# Patient Record
Sex: Female | Born: 2000 | Hispanic: Yes | Marital: Single | Smoking: Never smoker
Health system: Southern US, Community
[De-identification: ages and names within clinical notes are randomized; demographics above are authoritative.]

## PROBLEM LIST (undated history)

## (undated) DIAGNOSIS — H539 Unspecified visual disturbance: Secondary | ICD-10-CM

## (undated) HISTORY — DX: Unspecified visual disturbance: H53.9

---
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
## 2018-01-03 ENCOUNTER — Ambulatory Visit: Payer: Self-pay

## 2018-01-03 ENCOUNTER — Encounter: Payer: Self-pay | Admitting: Pediatrics

## 2018-01-12 ENCOUNTER — Ambulatory Visit (INDEPENDENT_AMBULATORY_CARE_PROVIDER_SITE_OTHER): Payer: Self-pay

## 2018-01-12 DIAGNOSIS — Z23 Encounter for immunization: Secondary | ICD-10-CM

## 2018-01-12 NOTE — Progress Notes (Signed)
Here for HepB #2. Allergies reviewed, no current illness or other concerns. Vaccine given and tolerated well. RTC 03/09/18 for appointment with Dr. Luna FuseEttefagh and prn for acute care.

## 2018-01-19 ENCOUNTER — Telehealth: Payer: Self-pay | Admitting: Pediatrics

## 2018-01-19 NOTE — Telephone Encounter (Signed)
Partially completed form placed in Dr. Ettefagh's folder. 

## 2018-01-19 NOTE — Telephone Encounter (Signed)
Mother Ronalee RedSandra Cortez-Diaz [phone] dropped off school physical & concussion forms to be completed by provider. Call mother when complete and ready for pick up. Placed forms in blue pod folder.

## 2018-01-20 ENCOUNTER — Telehealth: Payer: Self-pay | Admitting: Pediatrics

## 2018-01-20 NOTE — Telephone Encounter (Signed)
Please cal April Wagner asz soon form is ready for pick up @ [phone]

## 2018-01-20 NOTE — Telephone Encounter (Signed)
New form with completed first page given to Dr. Luna FuseEttefagh.

## 2018-01-20 NOTE — Telephone Encounter (Signed)
Completed form copied and taken to front desk. Mother notified.

## 2018-03-09 ENCOUNTER — Encounter: Payer: Self-pay | Admitting: Pediatrics

## 2018-03-09 ENCOUNTER — Ambulatory Visit (INDEPENDENT_AMBULATORY_CARE_PROVIDER_SITE_OTHER): Payer: Self-pay | Admitting: Pediatrics

## 2018-03-09 ENCOUNTER — Other Ambulatory Visit: Payer: Self-pay

## 2018-03-09 VITALS — BP 120/66 | Ht 65.0 in | Wt 172.4 lb

## 2018-03-09 DIAGNOSIS — Z9229 Personal history of other drug therapy: Secondary | ICD-10-CM

## 2018-03-09 DIAGNOSIS — S060X0A Concussion without loss of consciousness, initial encounter: Secondary | ICD-10-CM

## 2018-03-09 DIAGNOSIS — R7401 Elevation of levels of liver transaminase levels: Secondary | ICD-10-CM

## 2018-03-09 DIAGNOSIS — E663 Overweight: Secondary | ICD-10-CM

## 2018-03-09 DIAGNOSIS — R740 Nonspecific elevation of levels of transaminase and lactic acid dehydrogenase [LDH]: Secondary | ICD-10-CM

## 2018-03-09 NOTE — Progress Notes (Signed)
Subjective:    April Wagner is a 17 y.o. old female here with her mother for follow-up transaminitis.    HPI Currently on the soccer at team - playing defense.  Started in late February.  Exercising daily for soccer practice.  Still eating healthy foods with fruits and vegetables daily and drinking water.  Avoiding sweetened drinks.  Screen time is <2 hours.      Last had labs for transaminitis about 3 months ago and AST/ALT were improving at that time.  Has had a liver ultrasound shouing increased liver echogenicity consistent with fatty liver.    Got hit in the face on the right side by the soccer ball yesterday, the ball was kicked forcefully at a close range.  This was an accident.  She did not pass out or fall down but her glasses were knocked off her face.  She has a mild headache this morning when she woke up.  Face felt stinging and a little numb yesterday after the injury.  Today the numbness is gone but that side of her face feels sore.   She also has some marks on her face from where her glasses his her nose.  No eye injury or vision.     Review of Systems  Eyes: Negative for visual disturbance.  Skin: Negative for wound.  Neurological: Positive for headaches. Negative for dizziness, syncope, facial asymmetry, weakness, light-headedness and numbness.    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  April Wagner  has a past medical history of Vision abnormalities.  Immunizations needed: none     Objective:    BP 120/66 (BP Location: Right Arm, Patient Position: Sitting, Cuff Size: Normal)   Ht 5\' 5"  (1.651 m)   Wt 172 lb 6.4 oz (78.2 kg)   BMI 28.69 kg/m    Physical Exam  Constitutional: She is oriented to person, place, and time. She appears well-developed and well-nourished. No distress.  HENT:   Head: Normocephalic.  Right Ear: External ear normal.  Left Ear: External ear normal.  Nose: Nose normal.  Mouth/Throat: Oropharynx is clear and moist.  Normal TMs  Eyes: Pupils are equal, round, and reactive to light. Conjunctivae and EOM are normal. Right eye exhibits no discharge. Left eye exhibits no discharge.  Cardiovascular: Normal rate, regular rhythm and normal heart sounds.  Pulmonary/Chest: Effort normal and breath sounds normal.  Abdominal: Soft. Bowel sounds are normal. She exhibits no distension. There is no tenderness.  Neurological: She is alert and oriented to person, place, and time. She has normal reflexes. No cranial nerve deficit. Coordination normal.  Skin:  Very small bruise/abrasion on the right side of the nasal bridge and just below the right eyebrow  Nursing note and vitals reviewed.      Assessment and Plan:   April Wagner is a 17 y.o. old female with  1. Concussion without loss of consciousness, initial encounter Patient with mild headache this am.  No LOC or neuro deficits.  Mild bruising around right eye from glasses but no ocular involvement.  Plan for gradual return to play with monitoring for post-conscussive symptoms at each step.  No exercise today, may return to light jogging tomorrow, then fast running the next day, then soccer practice on Monday, then competition the next day.  If any return of symptoms, she will return to the previous day until asymptomatic. NOte given  for no soccer practice until Monday.  Supportive cares, return precautions, and emergency procedures reviewed.   2. Transaminitis Due for repeat labs today for monitoring.  Will call patient with results.  Will also repeat Hep B antibody testing today since she has had 2 repeat doses of the Hep B vaccine series and is getting labs drawn today.  IF still antibody negative she will need a 3rd Hep B vaccine dose in 3 months.   - Comprehensive metabolic panel - Gamma GT - Hepatitis B surface  antibody  3. Overweight Weight is up 5 pounds since last visit, but she reports adhering to healthy habits and increased physical activity.  Continue to monitor for now.       Return for 17 year old [facility] with Dr. Luna Fuse in 4 months.  Clifton Custard, MD

## 2018-03-10 LAB — COMPREHENSIVE METABOLIC PANEL
AG RATIO: 1.2 (calc) (ref 1.0–2.5)
ALBUMIN MSPROF: 4.5 g/dL (ref 3.6–5.1)
ALKALINE PHOSPHATASE (APISO): 121 U/L (ref 47–176)
ALT: 76 U/L — ABNORMAL HIGH (ref 5–32)
AST: 52 U/L — ABNORMAL HIGH (ref 12–32)
BUN: 10 mg/dL (ref 7–20)
CO2: 27 mmol/L (ref 20–32)
Calcium: 9.7 mg/dL (ref 8.9–10.4)
Chloride: 106 mmol/L (ref 98–110)
Creat: 0.64 mg/dL (ref 0.50–1.00)
Globulin: 3.9 g/dL (calc) — ABNORMAL HIGH (ref 2.0–3.8)
Glucose, Bld: 86 mg/dL (ref 65–99)
POTASSIUM: 4 mmol/L (ref 3.8–5.1)
SODIUM: 141 mmol/L (ref 135–146)
Total Bilirubin: 0.4 mg/dL (ref 0.2–1.1)
Total Protein: 8.4 g/dL — ABNORMAL HIGH (ref 6.3–8.2)

## 2018-03-10 LAB — GAMMA GT: GGT: 19 U/L (ref 6–26)

## 2018-03-10 LAB — HEPATITIS B SURFACE ANTIBODY,QUALITATIVE: Hep B S Ab: REACTIVE — AB

## 2018-07-11 ENCOUNTER — Ambulatory Visit: Payer: Self-pay

## 2018-08-25 ENCOUNTER — Ambulatory Visit (INDEPENDENT_AMBULATORY_CARE_PROVIDER_SITE_OTHER): Payer: Self-pay | Admitting: *Deleted

## 2018-08-25 DIAGNOSIS — Z23 Encounter for immunization: Secondary | ICD-10-CM

## 2018-08-29 ENCOUNTER — Encounter: Payer: Self-pay | Admitting: Pediatrics

## 2018-08-29 ENCOUNTER — Ambulatory Visit (INDEPENDENT_AMBULATORY_CARE_PROVIDER_SITE_OTHER): Payer: Self-pay | Admitting: Pediatrics

## 2018-08-29 ENCOUNTER — Other Ambulatory Visit: Payer: Self-pay

## 2018-08-29 ENCOUNTER — Ambulatory Visit (INDEPENDENT_AMBULATORY_CARE_PROVIDER_SITE_OTHER): Payer: Self-pay | Admitting: Licensed Clinical Social Worker

## 2018-08-29 VITALS — BP 118/60 | Ht 65.25 in | Wt 181.5 lb

## 2018-08-29 DIAGNOSIS — Z1331 Encounter for screening for depression: Secondary | ICD-10-CM

## 2018-08-29 DIAGNOSIS — Z0001 Encounter for general adult medical examination with abnormal findings: Secondary | ICD-10-CM

## 2018-08-29 DIAGNOSIS — R740 Nonspecific elevation of levels of transaminase and lactic acid dehydrogenase [LDH]: Secondary | ICD-10-CM

## 2018-08-29 DIAGNOSIS — Z68.41 Body mass index (BMI) pediatric, 85th percentile to less than 95th percentile for age: Secondary | ICD-10-CM

## 2018-08-29 DIAGNOSIS — Z113 Encounter for screening for infections with a predominantly sexual mode of transmission: Secondary | ICD-10-CM

## 2018-08-29 DIAGNOSIS — E663 Overweight: Secondary | ICD-10-CM

## 2018-08-29 DIAGNOSIS — R7401 Elevation of levels of liver transaminase levels: Secondary | ICD-10-CM

## 2018-08-29 LAB — POCT GLYCOSYLATED HEMOGLOBIN (HGB A1C): HEMOGLOBIN A1C: 5.2 % (ref 4.0–5.6)

## 2018-08-29 LAB — POCT RAPID HIV: RAPID HIV, POC: NEGATIVE

## 2018-08-29 NOTE — BH Specialist Note (Signed)
Integrated Behavioral Health Initial Visit  MRN: [id number] Name: April Wagner  Number of Integrated Behavioral Health Clinician visits:: 1/6 Session Start time: 9:37  Session End time: 9:40 Total time: 3 mins, no charge due to brief visit  Type of Service: Integrated Behavioral Health- Individual/Family Interpretor:No. Interpretor Name and Language: n/a [facility] E. Dewain Penningshola present for the length of the visit w/ pt's permission   Warm Hand Off Completed.       SUBJECTIVE: April Wagner is a 17 y.o. female accompanied by Mother Patient was referred by Dr. Luna FuseEttefagh for PHQ Review.  [facility] introduced services in Integrated Care Model and role within the clinic. [facility] provided [facility] Promo and business card with contact information. Pt voiced understanding and denied any need for services at this time. [facility] is open to visits in the future as needed.   OBJECTIVE: Mood: Euthymic and Affect: Appropriate Risk of harm to self or others: No plan to harm self or others   GOALS ADDRESSED: Patient will: 1. Identify barriers to social emotional development 2. Increase awareness of [facility] role in integrated care model  INTERVENTIONS: Interventions utilized: Psychoeducation and/or Health Education  Standardized Assessments completed: PHQ 9 Modified for Teens; score of 4, results in flowsheets  Noralyn PickHannah G Moore, LPCA

## 2018-08-29 NOTE — Progress Notes (Signed)
Adolescent Well Care Visit April NorrisKaren Wagner is a 17 y.o. female who is here for well care.    PCP:  April Wagner, April Ramaswamy Scott, MD   History was provided by the patient and mother.  Current Issues: Current concerns include   Transaminitis - AST and ALT were improved from prior but still elevated on last check 03/09/18.  AST was 52 and ALT was 76.  Patient reports that she has gotten several bills from the lab and is worried about them.  Nutrition: Nutrition/Eating Behaviors: eats a variety,  Except not much milk Adequate calcium in diet?: no Supplements/ Vitamins: none  Exercise/ Media: Play any Sports?/ Exercise: not doing much currently Screen Time:  > 2 hours-counseling provided Media Rules or Monitoring?: yes  Sleep:  Sleep: all night, light snoring  Social Screening: Lives with:  Parents and sister Parental relations:  good Activities, Work, and Regulatory affairs officerChores?: not working currently Stressors of note: yes - not in school and not working  Education: Not in school - graduated last spring  Menstruation:   No LMP recorded. Menstrual History: regular, every month, lasts 5 days   Confidential Social History: Tobacco?  no Secondhand smoke exposure?  no Drugs/ETOH?  no  Sexually Active?  no   Pregnancy Prevention: absintence   Safe at home & in relationships?  Yes Safe to self?  Yes   Screenings: Patient has a dental home: yes   PHQ-9 completed and results indicated no signs of depression (total score of 4)  Physical Exam:  Vitals:   08/29/18 0834  BP: 118/60  Weight: 181 lb 8 oz (82.3 kg)  Height: 5' 5.25" (1.657 m)   BP 118/60 (BP Location: Right Arm, Patient Position: Sitting, Cuff Size: Normal)   Ht 5' 5.25" (1.657 m)   Wt 181 lb 8 oz (82.3 kg)   BMI 29.97 kg/m  Body mass index: body mass index is 29.97 kg/m. Blood pressure percentiles are not available for patients who are 18 years or older.   Hearing Screening   Method: Audiometry   125Hz  250Hz  500Hz   1000Hz  2000Hz  3000Hz  4000Hz  6000Hz  8000Hz   Right ear:   20 20 20  20     Left ear:   20 20 20  20       Visual Acuity Screening   Right eye Left eye Both eyes  Without correction:     With correction: 10/10 10/10 10/10     General Appearance:   alert, oriented, no acute distress and well nourished  HENT: Normocephalic, no obvious abnormality, conjunctiva clear  Mouth:   Normal appearing teeth, no obvious discoloration, dental caries, or dental caps  Neck:   Supple; thyroid: no enlargement, symmetric, no tenderness/mass/nodules  Chest Tanner IV female  Lungs:   Clear to auscultation bilaterally, normal work of breathing  Heart:   Regular rate and rhythm, S1 and S2 normal, no murmurs;   Abdomen:   Soft, non-tender, no mass, or organomegaly  GU normal female external genitalia, pelvic not performed, Tanner stage V, shaved pubic hair  Musculoskeletal:   Tone and strength strong and symmetrical, all extremities               Lymphatic:   No cervical adenopathy  Skin/Hair/Nails:   Skin warm, dry and intact, no rashes, no bruises or petechiae  Neurologic:   Strength, gait, and coordination normal and age-appropriate     Assessment and Plan:   Routine screening for STI (sexually transmitted infection) Patient denies sexual activity - at risk age group. -  C. trachomatis/N. gonorrhoeae RNA - POCT Rapid HIV  Overweight, body mass index, pediatric, 85th percentile to less than 95th percentile for age 52-2-1-0 goals of healthy active living and MyPlate reviewed.  - POCT glycosylated hemoglobin (Hb A1C) -   Transaminitis Likely due to non-alcoholic fatty liver.  Due for repeat labs today.   - ALT - AST  Hearing screening result:normal Vision screening result: normal   Return for recheck healthy habits/liver function in 3 months with Dr. Luna Fuse.April Custard, MD

## 2018-08-30 LAB — AST: AST: 67 U/L — ABNORMAL HIGH (ref 12–32)

## 2018-08-30 LAB — C. TRACHOMATIS/N. GONORRHOEAE RNA
C. TRACHOMATIS RNA, TMA: NOT DETECTED
N. GONORRHOEAE RNA, TMA: NOT DETECTED

## 2018-08-30 LAB — ALT: ALT: 118 U/L — ABNORMAL HIGH (ref 5–32)

## 2018-10-24 ENCOUNTER — Telehealth: Payer: Self-pay | Admitting: Pediatrics

## 2018-10-24 NOTE — Telephone Encounter (Signed)
-----   Message from Laney PotashJennifer A Guzman sent at 09/12/2018  3:59 PM EDT ----- Regarding: RE: Dental referral question Patient just needs to call them, they don't need a referral.    ----- Message ----- From: Clifton CustardEttefagh, Kate Scott, MD Sent: 08/31/2018  10:22 AM EDT To: Laney PotashJennifer A Guzman Subject: Dental referral question                       This patient needs a referral to the adult dental clinic at the [facility] department office on [address] since she is 16 and has the orange card.  Do you know how I can put that referral in?  Thanks, Voncille LoKate Ettefagh

## 2018-10-24 NOTE — Telephone Encounter (Signed)
I called and advised her mother that she should contact the dental clinic directly.

## 2019-01-25 ENCOUNTER — Ambulatory Visit: Payer: Self-pay | Admitting: Pediatrics

## 2019-02-06 ENCOUNTER — Ambulatory Visit: Payer: Self-pay

## 2019-02-13 ENCOUNTER — Ambulatory Visit: Payer: Self-pay | Admitting: Pediatrics

## 2019-02-20 ENCOUNTER — Ambulatory Visit: Payer: Self-pay

## 2019-02-20 ENCOUNTER — Ambulatory Visit: Payer: Self-pay | Admitting: Pediatrics

## 2019-04-24 ENCOUNTER — Ambulatory Visit: Payer: Self-pay | Admitting: Pediatrics

## 2019-08-20 IMAGING — US US ABDOMEN LIMITED
1 series · 14 of 25 positions shown · non-contrast
Comparison: None.

CLINICAL DATA: Elevated liver enzymes

EXAM:
ULTRASOUND ABDOMEN LIMITED RIGHT UPPER QUADRANT

[Series 1: us abdomen limited · 0.21mm/px · 14 of 35 slices shown]
[im 1/35]
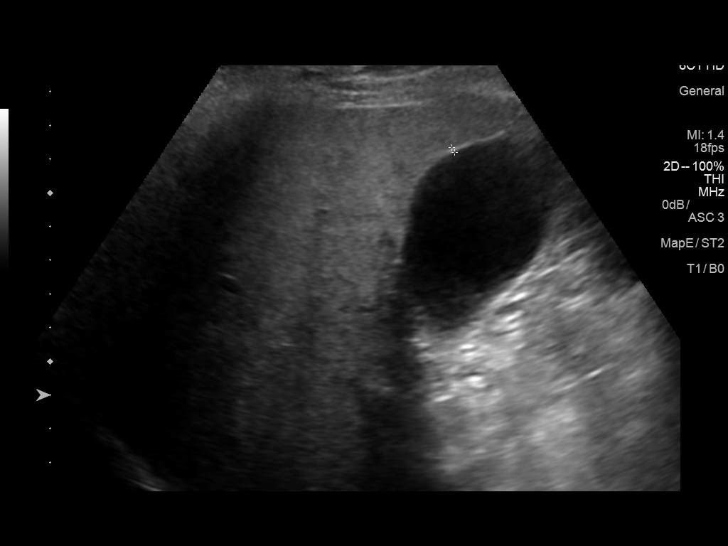
[im 3/35]
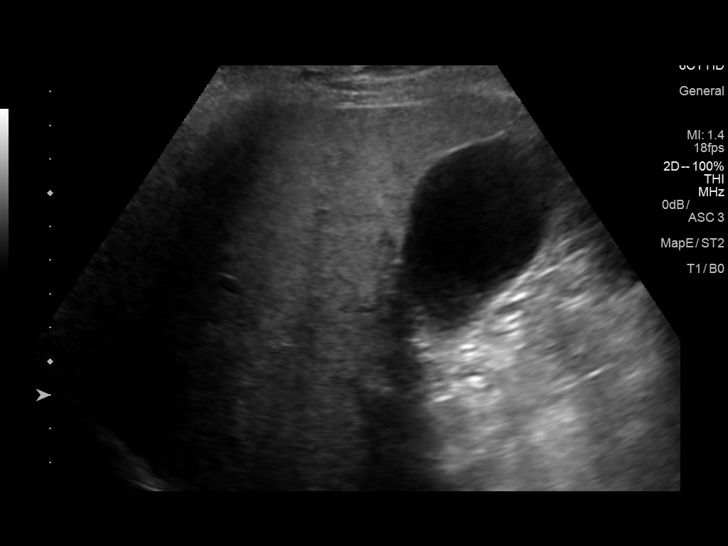
[im 6/35]
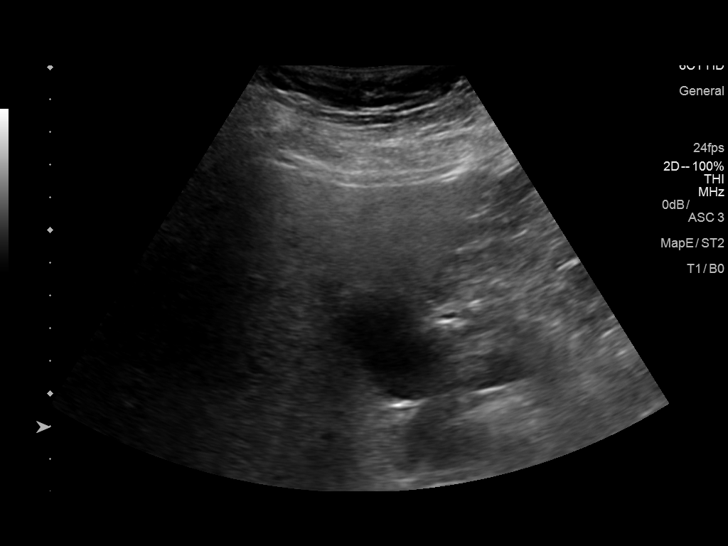
[im 9/35]
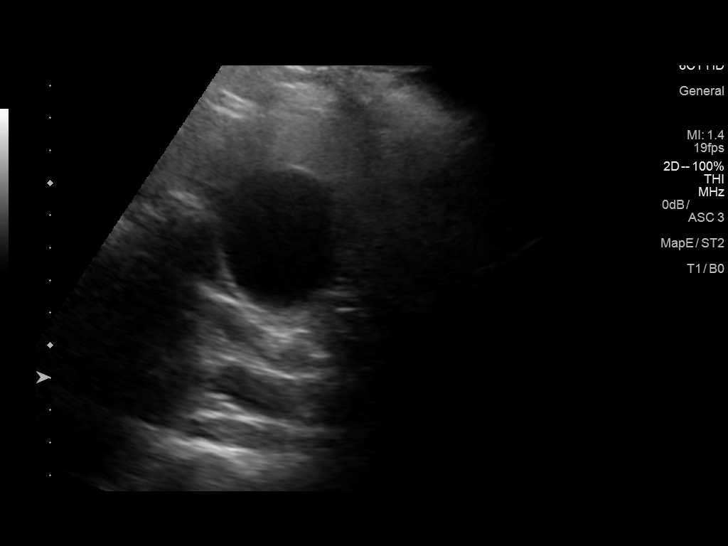
[im 12/35]
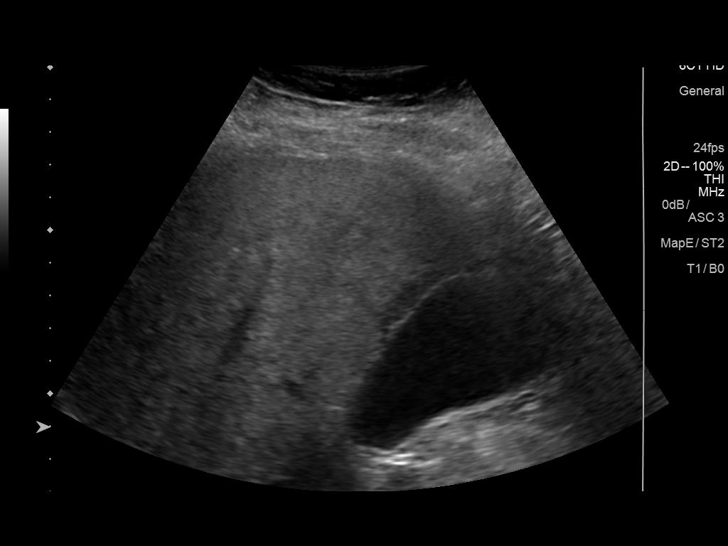
[im 13/35]
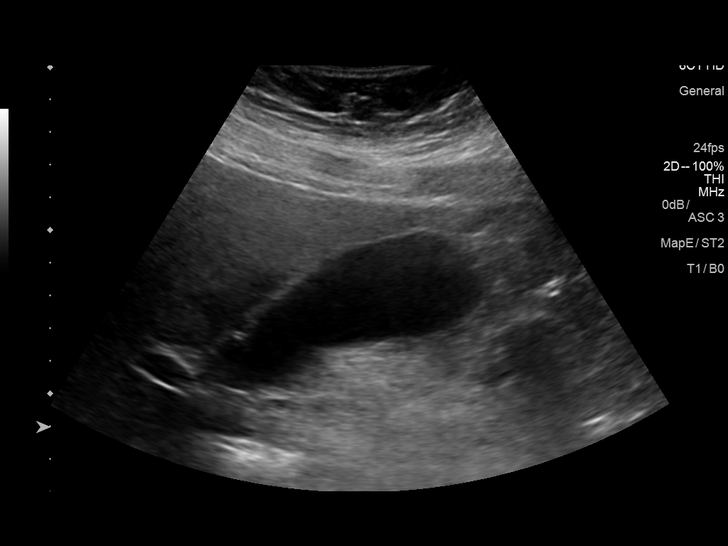
[im 16/35]
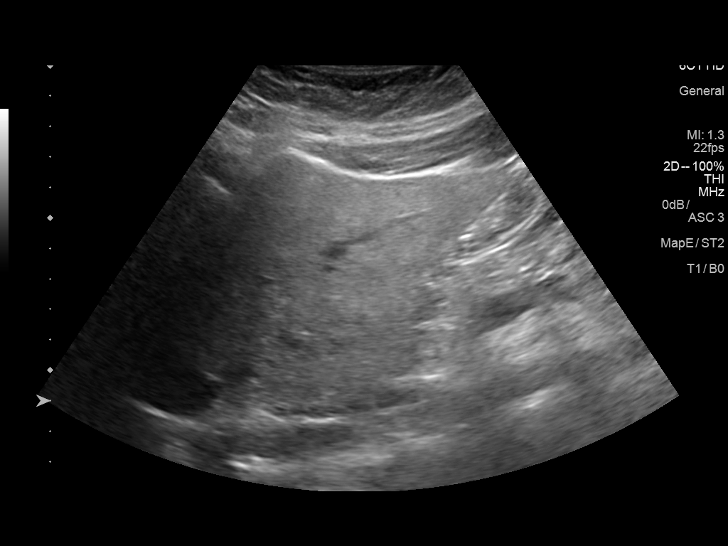
[im 19/35]
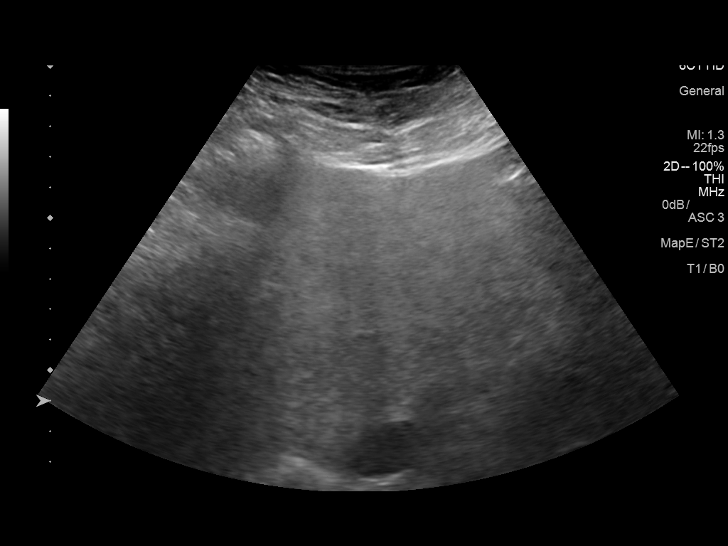
[im 22/35]
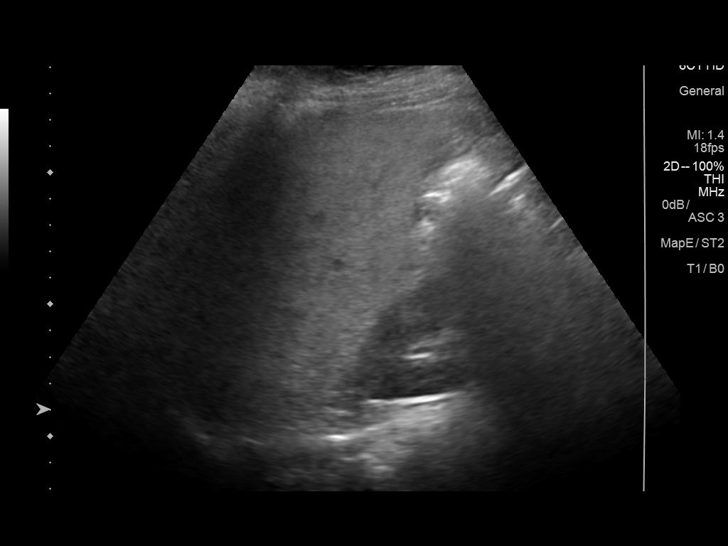
[im 23/35]
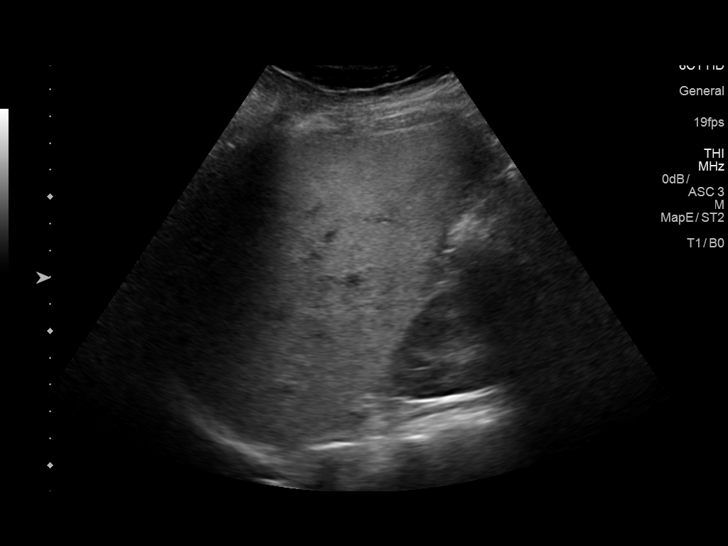
[im 26/35]
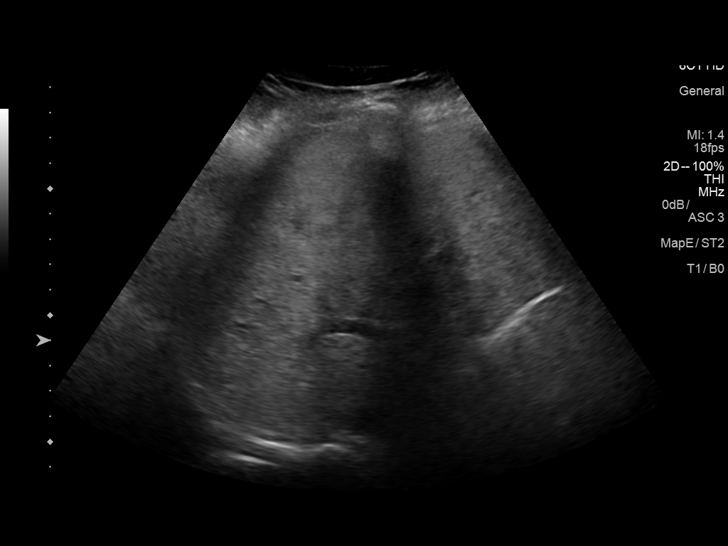
[im 29/35]
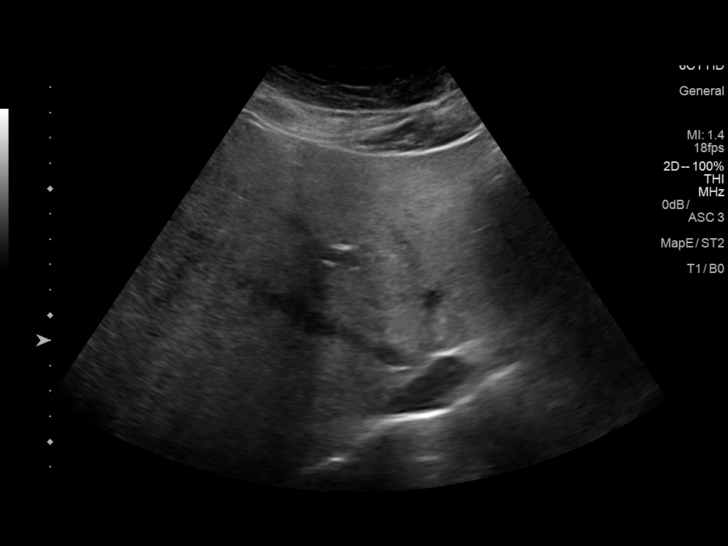
[im 32/35]
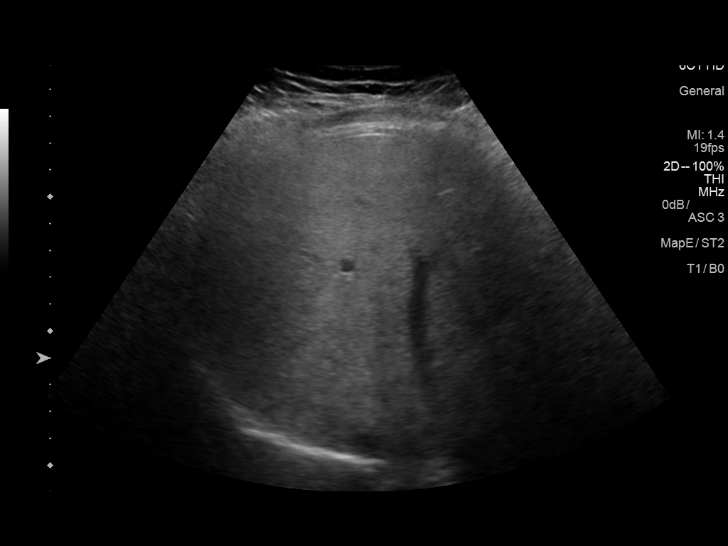
[im 35/35]
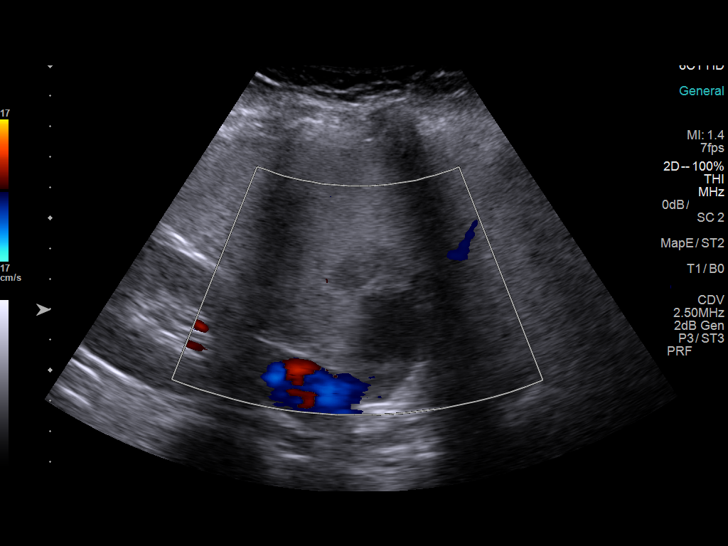

[14 of 25 positions shown; findings below may reference images not displayed]

FINDINGS: Gallbladder:

No gallstones or wall thickening visualized. There is no
pericholecystic fluid. No sonographic Murphy sign noted by
sonographer.

Common bile duct:

Diameter: 3 mm. No intrahepatic or extrahepatic biliary duct
dilatation.

Liver:

No focal lesion identified. Liver echogenicity is increased
diffusely. There may be mild fatty sparing near the gallbladder
fossa. Portal vein is patent on color Doppler imaging with normal
direction of blood flow towards the liver.
IMPRESSION: Increased liver echogenicity consistent with hepatic steatosis.
While no focal liver lesions are evident on this study, it must be
cautioned that sensitivity of ultrasound for detection of focal
liver lesions is diminished in this circumstance. Study otherwise
unremarkable.

## 2021-08-06 ENCOUNTER — Telehealth: Payer: Self-pay

## 2021-08-06 DIAGNOSIS — Z09 Encounter for follow-up examination after completed treatment for conditions other than malignant neoplasm: Secondary | ICD-10-CM

## 2021-08-06 NOTE — Telephone Encounter (Signed)
[employer] mailed adolescent transition dismissal letter to pt, marked dismissed  from practice in pt's chart due to age. Can be seen by [facility] until age 20 if needed.   Kenn File, BSW, QP Case Manager [facility] Office: [phone] Direct Number: [phone]
